# Patient Record
Sex: Male | Born: 1935 | Race: White | Hispanic: No | Smoking: Never smoker
Health system: Southern US, Community
[De-identification: ages and names within clinical notes are randomized; demographics above are authoritative.]

## PROBLEM LIST (undated history)

## (undated) DIAGNOSIS — G20 Parkinson's disease: Secondary | ICD-10-CM

## (undated) DIAGNOSIS — I739 Peripheral vascular disease, unspecified: Secondary | ICD-10-CM

## (undated) DIAGNOSIS — G629 Polyneuropathy, unspecified: Secondary | ICD-10-CM

## (undated) DIAGNOSIS — G20A1 Parkinson's disease without dyskinesia, without mention of fluctuations: Secondary | ICD-10-CM

## (undated) DIAGNOSIS — I82409 Acute embolism and thrombosis of unspecified deep veins of unspecified lower extremity: Secondary | ICD-10-CM

## (undated) DIAGNOSIS — I4891 Unspecified atrial fibrillation: Secondary | ICD-10-CM

## (undated) DIAGNOSIS — I10 Essential (primary) hypertension: Secondary | ICD-10-CM

## (undated) DIAGNOSIS — R4189 Other symptoms and signs involving cognitive functions and awareness: Secondary | ICD-10-CM

---

## 2017-08-25 ENCOUNTER — Encounter: Payer: Self-pay | Admitting: Emergency Medicine

## 2017-08-25 ENCOUNTER — Emergency Department
Admission: EM | Admit: 2017-08-25 | Discharge: 2017-08-26 | Disposition: A | Discharge status: Skilled Nursing Facility | Payer: Medicare Other | Attending: Emergency Medicine | Admitting: Emergency Medicine

## 2017-08-25 ENCOUNTER — Emergency Department: Payer: Medicare Other

## 2017-08-25 ENCOUNTER — Other Ambulatory Visit: Payer: Self-pay

## 2017-08-25 DIAGNOSIS — S0990XA Unspecified injury of head, initial encounter: Secondary | ICD-10-CM | POA: Diagnosis present

## 2017-08-25 DIAGNOSIS — S0101XA Laceration without foreign body of scalp, initial encounter: Secondary | ICD-10-CM | POA: Insufficient documentation

## 2017-08-25 DIAGNOSIS — Y92128 Other place in nursing home as the place of occurrence of the external cause: Secondary | ICD-10-CM | POA: Diagnosis not present

## 2017-08-25 DIAGNOSIS — Y999 Unspecified external cause status: Secondary | ICD-10-CM | POA: Diagnosis not present

## 2017-08-25 DIAGNOSIS — W19XXXA Unspecified fall, initial encounter: Secondary | ICD-10-CM | POA: Insufficient documentation

## 2017-08-25 DIAGNOSIS — S81812A Laceration without foreign body, left lower leg, initial encounter: Secondary | ICD-10-CM | POA: Diagnosis not present

## 2017-08-25 DIAGNOSIS — Y9389 Activity, other specified: Secondary | ICD-10-CM | POA: Diagnosis not present

## 2017-08-25 HISTORY — DX: Other symptoms and signs involving cognitive functions and awareness: R41.89

## 2017-08-25 HISTORY — DX: Essential (primary) hypertension: I10

## 2017-08-25 HISTORY — DX: Parkinson's disease without dyskinesia, without mention of fluctuations: G20.A1

## 2017-08-25 HISTORY — DX: Polyneuropathy, unspecified: G62.9

## 2017-08-25 HISTORY — DX: Parkinson's disease: G20

## 2017-08-25 HISTORY — DX: Unspecified atrial fibrillation: I48.91

## 2017-08-25 HISTORY — DX: Peripheral vascular disease, unspecified: I73.9

## 2017-08-25 HISTORY — DX: Acute embolism and thrombosis of unspecified deep veins of unspecified lower extremity: I82.409

## 2017-08-25 LAB — URINALYSIS, COMPLETE (UACMP) WITH MICROSCOPIC
Bilirubin Urine: NEGATIVE
Glucose, UA: NEGATIVE mg/dL
Hgb urine dipstick: NEGATIVE
Ketones, ur: 5 mg/dL — AB
Nitrite: NEGATIVE
PH: 5 (ref 5.0–8.0)
Protein, ur: NEGATIVE mg/dL
SPECIFIC GRAVITY, URINE: 1.019 (ref 1.005–1.030)

## 2017-08-25 NOTE — ED Notes (Signed)
Pt incontinent of urine and small amount of stool. Tolerated well. Provided for safety and comfort and will continue to assess.

## 2017-08-25 NOTE — ED Provider Notes (Signed)
[facility] Emergency Department Provider Note    First MD Initiated Contact with Patient 08/25/17 2302     (approximate)  I have reviewed the triage vital signs and the nursing notes.   HISTORY  Chief Complaint Fall; Shoulder Pain; and Laceration    HPI Martin Miranda is a 84 y.o. male with below list of chronic medical conditions presents to the emergency department following unwitnessed fall with resultant head injury.  Patient states that he had an accidental fall secondary to his walker.  Patient does admit to head injury however no loss of consciousness.   Past medical history Cognitive impairment impairment There are no active problems to display for this patient.     Prior to Admission medications   Not on File    Allergies No past medical history No family history on file.  Social History Social History   Tobacco Use  . Smoking status: Never Smoker  . Smokeless tobacco: Never Used  Substance Use Topics  . Alcohol use: Yes  . Drug use: Never    Review of Systems Constitutional: No fever/chills Eyes: No visual changes. ENT: No sore throat. Cardiovascular: Denies chest pain. Respiratory: Denies shortness of breath. Gastrointestinal: No abdominal pain.  No nausea, no vomiting.  No diarrhea.  No constipation. Genitourinary: Negative for dysuria. Musculoskeletal: Negative for neck pain.  Negative for back pain. Integumentary: Negative for rash. Neurological: Negative for headaches, focal weakness or numbness.   ____________________________________________   PHYSICAL EXAM:  VITAL SIGNS: ED Triage Vitals  Enc Vitals Group     BP      Pulse      Resp      Temp      Temp src      SpO2      Weight      Height      Head Circumference      Peak Flow      Pain Score      Pain Loc      Pain Edu?      Excl. in GC?     Constitutional: Alert and oriented. Well appearing and in no acute distress. Eyes: Conjunctivae  are normal. PERRL. EOMI. Head: 3 cm parietal scalp laceration Nose: No congestion/rhinnorhea. Mouth/Throat: Mucous membranes are moist. Oropharynx non-erythematous. Neck: No stridor.  No meningeal signs.  No cervical spine tenderness to palpation. Cardiovascular: Normal rate, regular rhythm. Good peripheral circulation. Grossly normal heart sounds. Respiratory: Normal respiratory effort.  No retractions. Lungs CTAB. Gastrointestinal: Soft and nontender. No distention.  Musculoskeletal: Bilateral lower extremity pitting edema worse on the right Neurologic:  Normal speech and language. No gross focal neurologic deficits are appreciated.  Skin: Bilateral anterior shin skin tears.  3 cm linear scalp laceration left parietal area. Psychiatric: Mood and affect are normal. Speech and behavior are normal.    RADIOLOGY I, [place] N BROWN, personally viewed and evaluated these images (plain radiographs) as part of my medical decision making, as well as reviewing the written report by the radiologist.  ED MD interpretation: CT head revealed no acute intracranial findings.   _______________________  Procedure(s) performed:   Marland Kitchen.Marland Kitchen.Laceration Repair Date/Time: 08/26/2017 5:04 AM Performed by: Darci CurrentBrown, [place] N, MD Authorized by: Darci CurrentBrown, Bryce N, MD   Consent:    Consent obtained:  Verbal   Consent given by:  Patient   Risks discussed:  Infection, pain, retained foreign body, poor cosmetic result and poor wound healing Anesthesia (see MAR for exact dosages):  Anesthesia method:  Local infiltration   Local anesthetic:  Lidocaine 1% w/o epi Laceration details:    Location:  Scalp   Scalp location:  L parietal   Length (cm):  3   Depth (mm):  10 Repair type:    Repair type:  Simple Exploration:    Hemostasis achieved with:  Direct pressure   Wound exploration: entire depth of wound probed and visualized     Contaminated: no   Treatment:    Area cleansed with:  Saline   Amount of  cleaning:  Extensive   Irrigation solution:  Sterile saline   Visualized foreign bodies/material removed: no   Skin repair:    Repair method:  Staples   Number of staples:  3 Approximation:    Approximation:  Close Post-procedure details:    Dressing:  Sterile dressing   Patient tolerance of procedure:  Tolerated well, no immediate complications     ____________________________________________   INITIAL IMPRESSION / ASSESSMENT AND PLAN / ED COURSE  As part of my medical decision making, I reviewed the following data within the electronic MEDICAL RECORD NUMBER   84 year old male presenting with above-stated history and physical exam secondary to accidental fall.  Patient with resultant left parietal scalp laceration which was repaired with staples without any difficulty.  X-ray finding revealed possible scapular fracture however no pain with palpation in the area. ____________________________________________  FINAL CLINICAL IMPRESSION(S) / ED DIAGNOSES  Final diagnoses:  Scalp laceration, initial encounter  Skin tear of left lower leg without complication, initial encounter     MEDICATIONS GIVEN DURING THIS VISIT:  Medications  lidocaine (PF) (XYLOCAINE) 1 % injection (has no administration in time range)     ED Discharge Orders    None       Note:  This document was prepared using Dragon voice recognition software and may include unintentional dictation errors.    Darci CurrentBrown, [place] N, MD 08/26/17 [phone]

## 2017-08-25 NOTE — ED Triage Notes (Addendum)
[facility] after he had an  unwitnessed fall while attempting to get out of his chair. Pt has abrasion to his left shoulder, laceration near his left ear, and to his right leg. Denies loc. +movement of his extremities and able to answer questions without difficulty.

## 2017-08-26 DIAGNOSIS — S0101XA Laceration without foreign body of scalp, initial encounter: Secondary | ICD-10-CM | POA: Diagnosis not present

## 2017-08-26 LAB — COMPREHENSIVE METABOLIC PANEL
ALT: 7 U/L (ref 0–44)
ANION GAP: 7 (ref 5–15)
AST: 26 U/L (ref 15–41)
Albumin: 3.8 g/dL (ref 3.5–5.0)
Alkaline Phosphatase: 62 U/L (ref 38–126)
BUN: 20 mg/dL (ref 8–23)
CHLORIDE: 103 mmol/L (ref 98–111)
CO2: 25 mmol/L (ref 22–32)
CREATININE: 1.01 mg/dL (ref 0.61–1.24)
Calcium: 8.9 mg/dL (ref 8.9–10.3)
GFR calc non Af Amer: >60 mL/min (ref >60)
Glucose, Bld: 86 mg/dL (ref 70–99)
POTASSIUM: 4.1 mmol/L (ref 3.5–5.1)
SODIUM: 135 mmol/L (ref 135–145)
Total Bilirubin: 1.4 mg/dL — ABNORMAL HIGH (ref 0.3–1.2)
Total Protein: 6.4 g/dL — ABNORMAL LOW (ref 6.5–8.1)

## 2017-08-26 LAB — CBC
HCT: 41.2 % (ref 40.0–52.0)
Hemoglobin: 14.3 g/dL (ref 13.0–18.0)
MCH: 35.1 pg — AB (ref 26.0–34.0)
MCHC: 34.6 g/dL (ref 32.0–36.0)
MCV: 101.3 fL — ABNORMAL HIGH (ref 80.0–100.0)
PLATELETS: 178 10*3/uL (ref 150–440)
RBC: 4.07 MIL/uL — ABNORMAL LOW (ref 4.40–5.90)
RDW: 13 % (ref 11.5–14.5)
WBC: 6.3 10*3/uL (ref 3.8–10.6)

## 2017-08-26 LAB — TROPONIN I

## 2017-08-26 MED ORDER — LIDOCAINE HCL (PF) 1 % IJ SOLN
INTRAMUSCULAR | Status: AC
  Filled 2017-08-26: qty 5

## 2017-10-08 ENCOUNTER — Emergency Department
Admission: EM | Admit: 2017-10-08 | Discharge: 2017-10-08 | Disposition: A | Discharge status: Skilled Nursing Facility | Payer: Medicare Other | Attending: Emergency Medicine | Admitting: Emergency Medicine

## 2017-10-08 ENCOUNTER — Emergency Department: Payer: Medicare Other

## 2017-10-08 ENCOUNTER — Encounter: Payer: Self-pay | Admitting: Emergency Medicine

## 2017-10-08 ENCOUNTER — Other Ambulatory Visit: Payer: Self-pay

## 2017-10-08 DIAGNOSIS — Z7901 Long term (current) use of anticoagulants: Secondary | ICD-10-CM | POA: Insufficient documentation

## 2017-10-08 DIAGNOSIS — I10 Essential (primary) hypertension: Secondary | ICD-10-CM | POA: Diagnosis not present

## 2017-10-08 DIAGNOSIS — Z79899 Other long term (current) drug therapy: Secondary | ICD-10-CM | POA: Diagnosis not present

## 2017-10-08 DIAGNOSIS — G20 Parkinson's disease: Secondary | ICD-10-CM | POA: Insufficient documentation

## 2017-10-08 DIAGNOSIS — F0390 Unspecified dementia without behavioral disturbance: Secondary | ICD-10-CM | POA: Diagnosis present

## 2017-10-08 LAB — COMPREHENSIVE METABOLIC PANEL
ALBUMIN: 3.4 g/dL — AB (ref 3.5–5.0)
ALT: 5 U/L (ref 0–44)
AST: 30 U/L (ref 15–41)
Alkaline Phosphatase: 92 U/L (ref 38–126)
Anion gap: 10 (ref 5–15)
BUN: 16 mg/dL (ref 8–23)
CO2: 23 mmol/L (ref 22–32)
Calcium: 9 mg/dL (ref 8.9–10.3)
Chloride: 101 mmol/L (ref 98–111)
Creatinine, Ser: 1.02 mg/dL (ref 0.61–1.24)
GFR calc Af Amer: >60 mL/min (ref >60)
GFR calc non Af Amer: >60 mL/min (ref >60)
GLUCOSE: 85 mg/dL (ref 70–99)
Potassium: 4.3 mmol/L (ref 3.5–5.1)
SODIUM: 134 mmol/L — AB (ref 135–145)
TOTAL PROTEIN: 6.6 g/dL (ref 6.5–8.1)
Total Bilirubin: 2.3 mg/dL — ABNORMAL HIGH (ref 0.3–1.2)

## 2017-10-08 LAB — CBC
HCT: 40.2 % (ref 40.0–52.0)
Hemoglobin: 14.3 g/dL (ref 13.0–18.0)
MCH: 36.4 pg — ABNORMAL HIGH (ref 26.0–34.0)
MCHC: 35.5 g/dL (ref 32.0–36.0)
MCV: 102.5 fL — ABNORMAL HIGH (ref 80.0–100.0)
Platelets: 236 10*3/uL (ref 150–440)
RBC: 3.92 MIL/uL — ABNORMAL LOW (ref 4.40–5.90)
RDW: 13.5 % (ref 11.5–14.5)
WBC: 7.3 10*3/uL (ref 3.8–10.6)

## 2017-10-08 LAB — TROPONIN I: Troponin I: <0.03 ng/mL (ref <0.03)

## 2017-10-08 NOTE — ED Provider Notes (Signed)
[facility] Emergency Department Provider Note   ____________________________________________    I have reviewed the triage vital signs and the nursing notes.   HISTORY  Chief Complaint Altered Mental Status   Patient unable to provide significant history  HPI Martin Miranda is a 84 y.o. male with a history of Parkinson's disease and dementia who presents with reports of "hallucinations ".  Per daughter the patient has recently come down to live at [facility] and has had difficulty adjusting and seems to have worsened over the last several months.  His wife of many years also passed away within the last 2 months.  Reportedly the patient has been more confused particularly in the morning, this is been attributed to urinary tract infections which apparently been treated.  No reports of fever patient has no complaints   Past Medical History:  Diagnosis Date  . Atrial fibrillation (HCC)   . Benign essential HTN   . Cognitive impairment   . DVT (deep venous thrombosis) (HCC)   . Parkinson disease (HCC)   . Peripheral neuropathy   . PVD (peripheral vascular disease) (HCC)     There are no active problems to display for this patient.   History reviewed. No pertinent surgical history.  Prior to Admission medications   Medication Sig Start Date End Date Taking? Authorizing Provider  albuterol (PROVENTIL HFA;VENTOLIN HFA) 108 (90 Base) MCG/ACT inhaler Inhale 2 puffs into the lungs 4 (four) times daily as needed for wheezing.   Yes [provider]  multivitamin (ONE-A-DAY MEN'S) TABS tablet Take 1 tablet by mouth daily.   Yes [provider]  oxybutynin (DITROPAN) 5 MG tablet Take 5 mg by mouth 2 (two) times daily.   Yes [provider]  potassium chloride (K-DUR,KLOR-CON) 10 MEQ tablet Take 10 mEq by mouth daily.   Yes [provider]  pramipexole (MIRAPEX) 0.5 MG tablet Take 0.5 mg by mouth 3 (three) times daily.    Yes [provider]  QUEtiapine (SEROQUEL) 25 MG tablet Take 25 mg by mouth 2 (two) times daily as needed (for anxiety/agitation).   Yes [provider]  sulfamethoxazole-trimethoprim (BACTRIM DS,SEPTRA DS) 800-160 MG tablet Take 1 tablet by mouth 2 (two) times daily. For 1 week 10/01/17 10/15/17 Yes [provider]  tamsulosin (FLOMAX) 0.4 MG CAPS capsule Take 0.4 mg by mouth daily.   Yes [provider]  traMADol (ULTRAM) 50 MG tablet Take 50 mg by mouth 3 (three) times daily.   Yes [provider]  warfarin (COUMADIN) 5 MG tablet Take 5 mg by mouth at bedtime.    [provider]     Allergies Patient has no known allergies.  History reviewed. No pertinent family history.  Social History Social History   Tobacco Use  . Smoking status: Never Smoker  . Smokeless tobacco: Never Used  Substance Use Topics  . Alcohol use: Yes  . Drug use: Never    Level 5 caveat: Unable to obtain review of Systems due to dementia    ____________________________________________   PHYSICAL EXAM:  VITAL SIGNS: ED Triage Vitals  Enc Vitals Group     BP 10/08/17 0816 107/85     Pulse Rate 10/08/17 0816 85     Resp 10/08/17 0816 (!) 24     Temp 10/08/17 0816 98.3 F (36.8 C)     Temp Source 10/08/17 0816 Oral     SpO2 10/08/17 0816 99 %     Weight 10/08/17 0817  90.7 kg (200 lb)     Height 10/08/17 0817 1.803 m (5\' 11" )     Head Circumference --      Peak Flow --      Pain Score 10/08/17 0817 10     Pain Loc --      Pain Edu? --      Excl. in GC? --     Constitutional: Alert, no acute distress Eyes: Conjunctivae are normal.  Head: Atraumatic. Nose: No congestion/rhinnorhea. Mouth/Throat: Mucous membranes are dry  Cardiovascular: Normal rate, irregular rhythm.  Good peripheral circulation. Respiratory: Normal respiratory effort.  No retractions. Lungs CTAB. Gastrointestinal: Soft and nontender. No distention.   Musculoskeletal:  Unna boots bilaterally, warm and well perfused Neurologic:  Normal speech and language. No gross focal neurologic deficits are appreciated.  Skin:  Skin is warm, dry and intact. No rash noted. Psychiatric: Mood and affect are normal. Speech and behavior are normal.  ____________________________________________   LABS (all labs ordered are listed, but only abnormal results are displayed)  Labs Reviewed  CBC - Abnormal; Notable for the following components:      Result Value   RBC 3.92 (*)    MCV 102.5 (*)    MCH 36.4 (*)    All other components within normal limits  COMPREHENSIVE METABOLIC PANEL - Abnormal; Notable for the following components:   Sodium 134 (*)    Albumin 3.4 (*)    Total Bilirubin 2.3 (*)    All other components within normal limits  TROPONIN I   ____________________________________________  EKG  ED ECG REPORT I, Jene Every, the attending physician, personally viewed and interpreted this ECG.  Date: 10/08/2017  Rhythm: atrial fibrillation QRS Axis: normal Intervals: Abnormal ST/T Wave abnormalities: normal Narrative Interpretation: no evidence of acute ischemia  ____________________________________________  RADIOLOGY  Chest x-ray unremarkable ____________________________________________   PROCEDURES  Procedure(s) performed: No  Procedures   Critical Care performed: No ____________________________________________   INITIAL IMPRESSION / ASSESSMENT AND PLAN / ED COURSE  Pertinent labs & imaging results that were available during my care of the patient were reviewed by me and considered in my medical decision making (see chart for details).  Patient presents for reported bizarre behavior/hallucinations.  Does have a history of sundowning, recent death of his wife and moving to a new facility likely exacerbating conditions as well.  We will check labs including urinalysis, chest x-ray, CT head and reevaluate  Work-up is overall  unremarkable.  Discussed with daughter that symptoms are likely a result of worsening dementia.  Social worker and to see daughter to discuss nursing home possibilities or respite care.  No indication for admission at this time will discharge with EMS to bring the patient back to [facility]    ____________________________________________   FINAL CLINICAL IMPRESSION(S) / ED DIAGNOSES  Final diagnoses:  Dementia without behavioral disturbance, unspecified dementia type [facility])        Note:  This document was prepared using Dragon voice recognition software and may include unintentional dictation errors.    Jene Every, MD 10/08/17 1331

## 2017-10-08 NOTE — ED Notes (Signed)
Spoke with Marylene Land, LPN and Therapist, art at [facility] [employer]. Gave update and advised her that pt will be discharged and transported back to facility. No further questions or concerns addressed.

## 2017-10-08 NOTE — ED Notes (Signed)
Waiting on [facility] to arrive for transport  

## 2017-10-08 NOTE — ED Notes (Signed)
Attempted to in and out cath without success. Will notify MD.

## 2017-10-08 NOTE — ED Notes (Signed)
Pt moved to 16h per charge RN. Devan, RN made aware, pt resting, denies any complaints.

## 2017-10-08 NOTE — ED Notes (Signed)
MD notified of attempt to in and out cath patient unsuccessfully. MD also aware that patient had scant bleeding from penis after attempt.

## 2017-10-08 NOTE — Progress Notes (Signed)
LCSW met with patient and daughter. She resides in [facility] and will make her own arrangements to have him placed in a memory care facility of her choosing. LCSW reviewed the process and patients daughter will follow up with the facility rep and patients family doctor to have Fl2 and Passr number obtained.  Patient is to be transported back to [facility]. His daughter will increase patients in home support until he can be transferred to new facility in near future. LCSW provided cell phone number and will make myself available for any calls.  [employer] LCSW [phone]

## 2017-10-08 NOTE — ED Notes (Signed)
Pt resting in bed with eyes closed. Respirations are even and non labored. No distress noted. Daughter at bedside. Pt is pending visit from social work and then he will be discharged back to facility.

## 2017-10-08 NOTE — ED Notes (Signed)
[facility] here to transport pt back to [facility].

## 2017-10-08 NOTE — ED Triage Notes (Signed)
Pt presents to ED via [facility] with c/o hallucinations/AMS. Pt arrives from [facility] with noted anxiety. Per EMS [facility] unable to tell what kind of hallucinations patient is having regarding A/V. EMS reports that per staff at [facility], pt had similar episode last week and began having hallucinations again last night. EMS also reports strong urine smell at this time.

## 2017-10-12 ENCOUNTER — Ambulatory Visit: Payer: Self-pay | Admitting: Urology

## 2017-10-30 ENCOUNTER — Ambulatory Visit: Payer: Self-pay | Admitting: Urology

## 2017-11-06 DEATH — deceased

## 2019-03-18 IMAGING — CT CT HEAD W/O CM
3 of 4 series · 15 of 47 positions shown, 18 images · non-contrast
Comparison: CT scan of August 25, 2017.

CLINICAL DATA: Altered level of consciousness.

EXAM:
CT HEAD WITHOUT CONTRAST
TECHNIQUE: Contiguous axial images were obtained from the base of the skull
through the vertex without intravenous contrast.

[Series 2: head wo · axial · 0.44mm/px · z∈[-133,-3]mm · 9 of 31 slices shown, 12 images]
[im 3/31  brain]
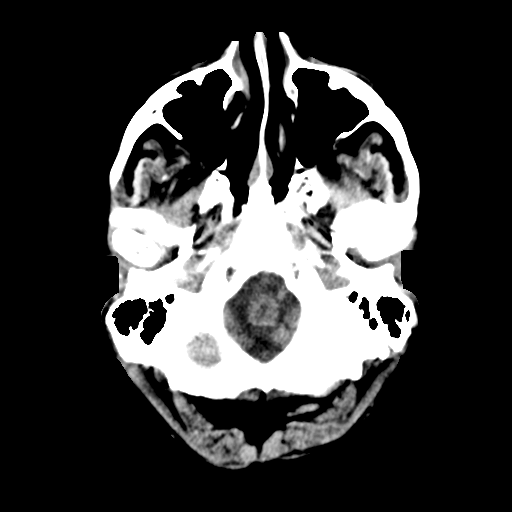
[im 3/31  bone]
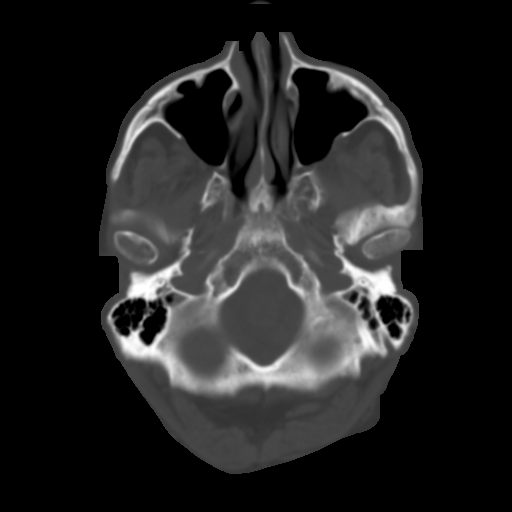
[im 7/31  brain]
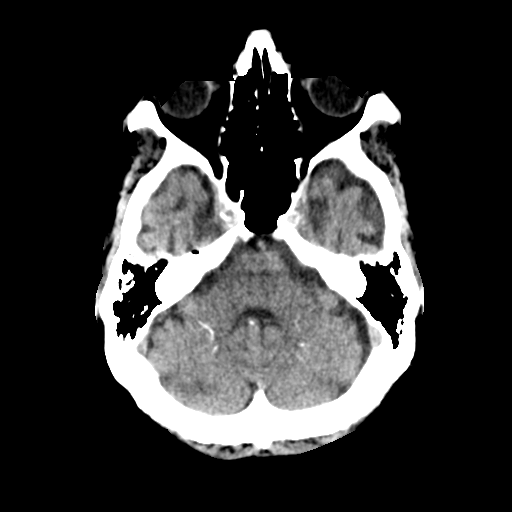
[im 9/31  brain]
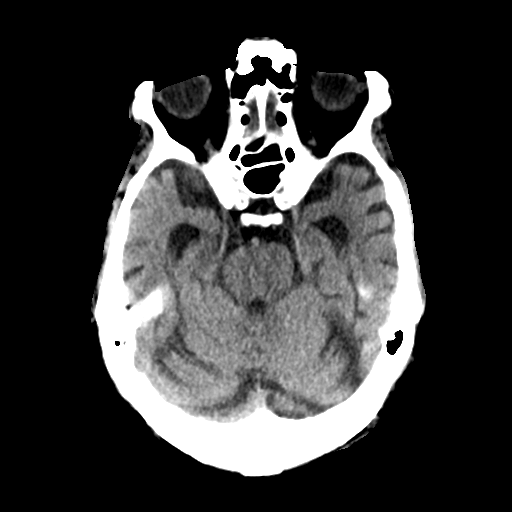
[im 13/31  brain]
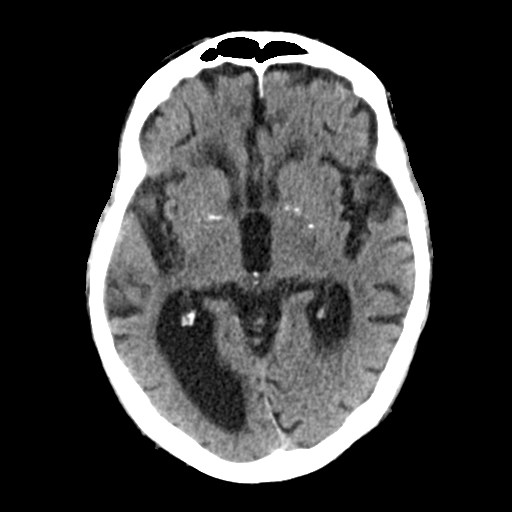
[im 17/31  brain]
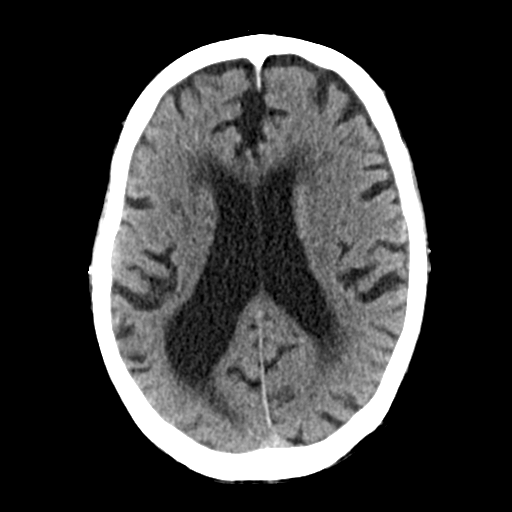
[im 17/31  bone]
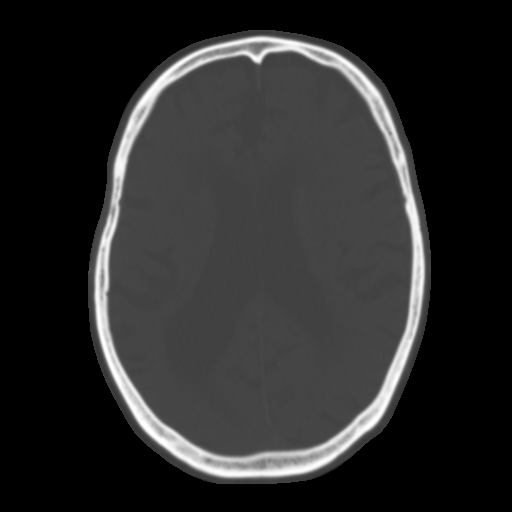
[im 19/31  brain]
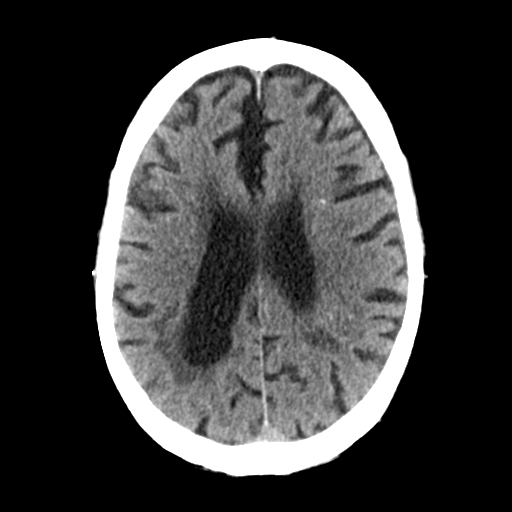
[im 23/31  brain]
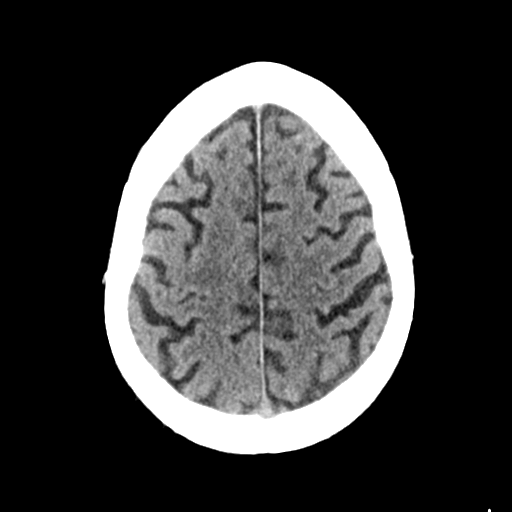
[im 25/31  brain]
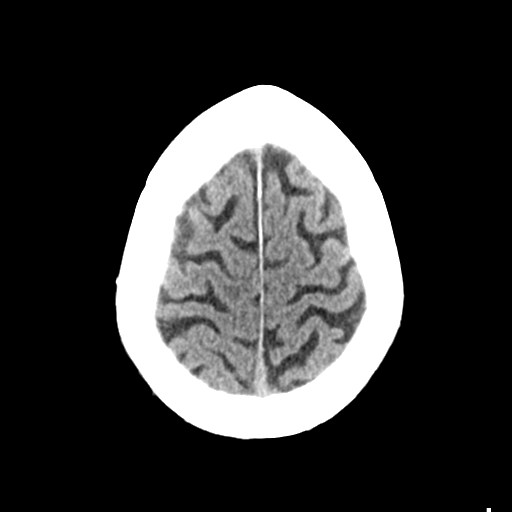
[im 29/31  brain]
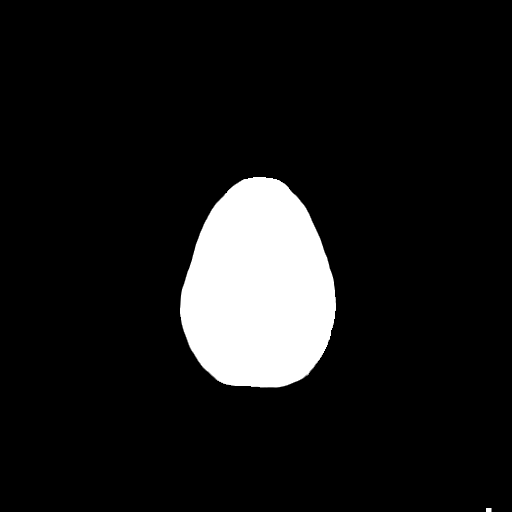
[im 29/31  bone]
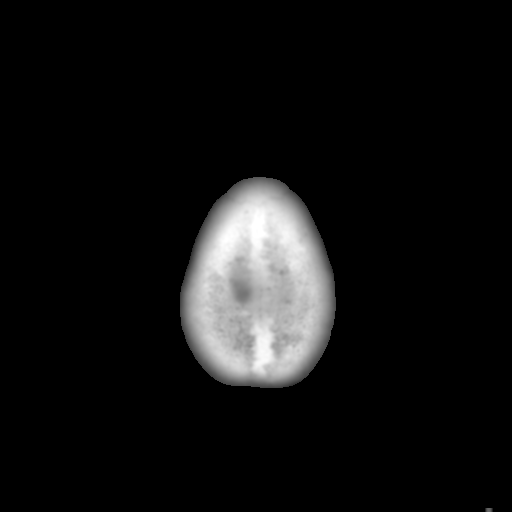

[Series 4: coronal soft tissue · coronal · 0.30mm/px · 3 of 66 slices shown]
[im 22/66  brain]
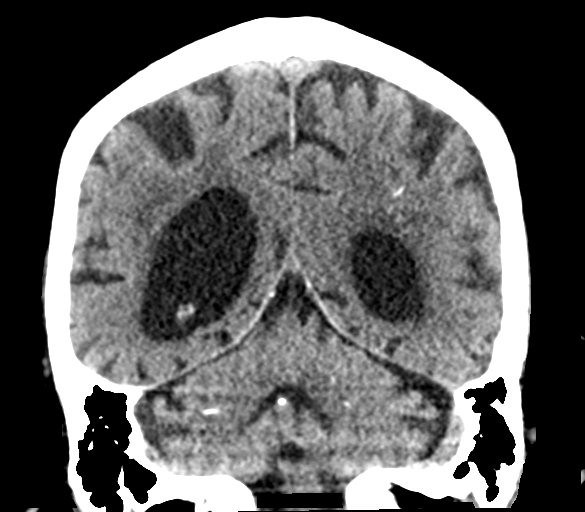
[im 29/66  brain]
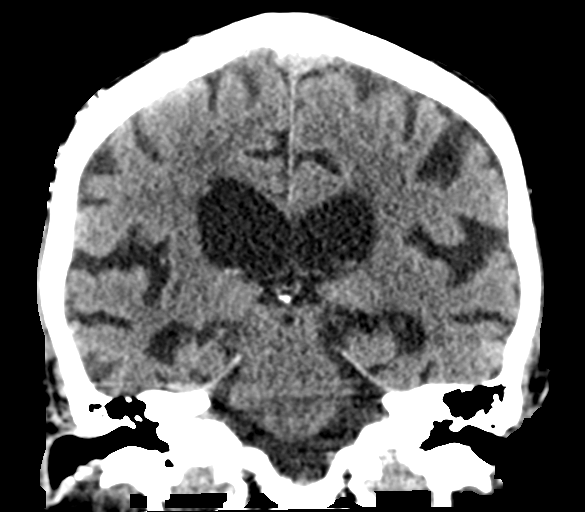
[im 37/66  brain]
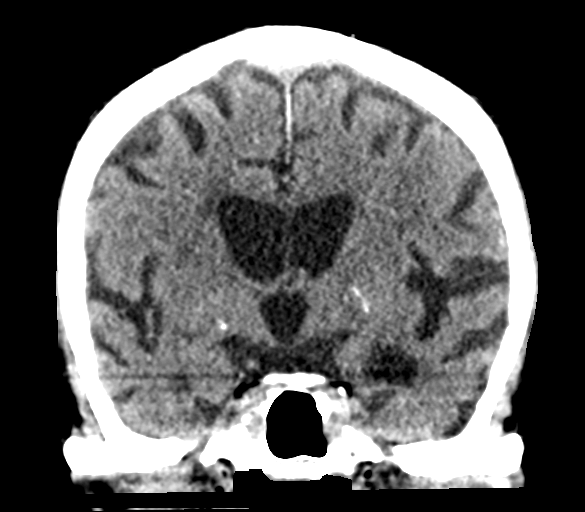

[Series 5: sagittal soft tissue · sagittal · 0.31mm/px · 3 of 51 slices shown]
[im 17/51  brain]
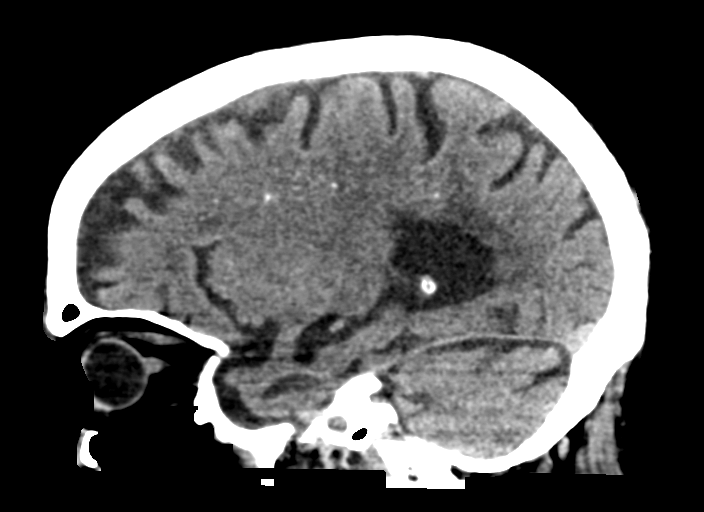
[im 26/51  brain]
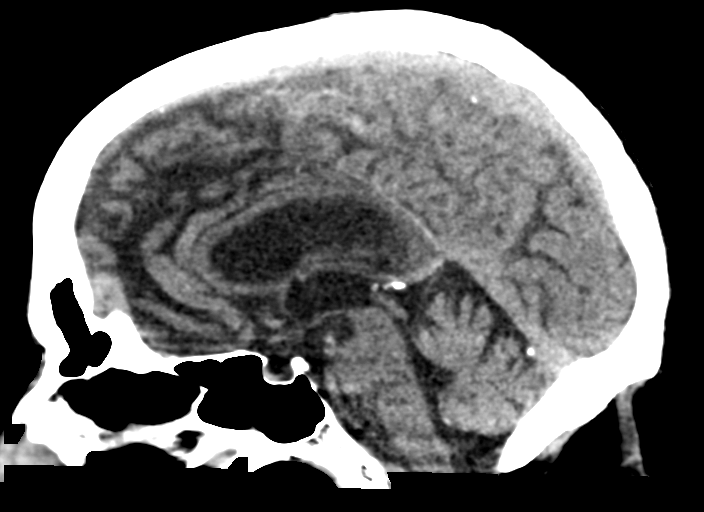
[im 34/51  brain]
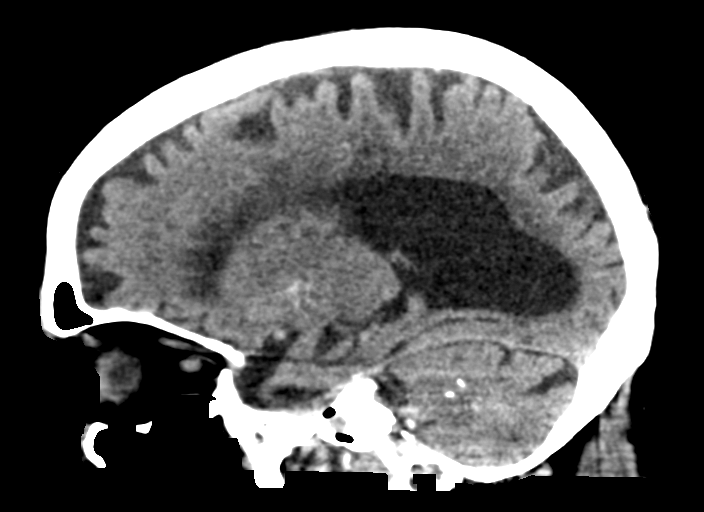

[15 of 47 positions shown; findings below may reference images not displayed]

FINDINGS: Brain: Mild diffuse cortical atrophy is noted. Mild chronic ischemic
white matter disease is noted. No mass effect or midline shift is
noted. Ventricular size is within normal limits. There is no
evidence of mass lesion, hemorrhage or acute infarction.

Vascular: No hyperdense vessel or unexpected calcification.

Skull: Normal. Negative for fracture or focal lesion.

Sinuses/Orbits: No acute finding.

Other: None.
IMPRESSION: Mild diffuse cortical atrophy. Mild chronic ischemic white matter
disease. No acute intracranial abnormality seen.
